# Patient Record
Sex: Female | Born: 1960 | Race: Black or African American | Hispanic: No | Smoking: Current every day smoker
Health system: Southern US, Community
[De-identification: ages and names within clinical notes are randomized; demographics above are authoritative.]

## PROBLEM LIST (undated history)

## (undated) DIAGNOSIS — E119 Type 2 diabetes mellitus without complications: Secondary | ICD-10-CM

## (undated) DIAGNOSIS — I10 Essential (primary) hypertension: Secondary | ICD-10-CM

## (undated) DIAGNOSIS — J45909 Unspecified asthma, uncomplicated: Secondary | ICD-10-CM

## (undated) DIAGNOSIS — E7800 Pure hypercholesterolemia, unspecified: Secondary | ICD-10-CM

---

## 2021-09-04 ENCOUNTER — Encounter (HOSPITAL_BASED_OUTPATIENT_CLINIC_OR_DEPARTMENT_OTHER): Payer: Self-pay | Admitting: Emergency Medicine

## 2021-09-04 ENCOUNTER — Emergency Department (HOSPITAL_BASED_OUTPATIENT_CLINIC_OR_DEPARTMENT_OTHER): Payer: Medicare HMO

## 2021-09-04 ENCOUNTER — Other Ambulatory Visit: Payer: Self-pay

## 2021-09-04 ENCOUNTER — Emergency Department (HOSPITAL_BASED_OUTPATIENT_CLINIC_OR_DEPARTMENT_OTHER)
Admission: EM | Admit: 2021-09-04 | Discharge: 2021-09-04 | Disposition: A | Discharge status: Home / Self Care | Payer: Medicare HMO | Attending: Emergency Medicine | Admitting: Emergency Medicine

## 2021-09-04 DIAGNOSIS — I10 Essential (primary) hypertension: Secondary | ICD-10-CM | POA: Diagnosis not present

## 2021-09-04 DIAGNOSIS — E119 Type 2 diabetes mellitus without complications: Secondary | ICD-10-CM | POA: Diagnosis not present

## 2021-09-04 DIAGNOSIS — M545 Low back pain, unspecified: Secondary | ICD-10-CM

## 2021-09-04 DIAGNOSIS — M47896 Other spondylosis, lumbar region: Secondary | ICD-10-CM | POA: Insufficient documentation

## 2021-09-04 DIAGNOSIS — F17210 Nicotine dependence, cigarettes, uncomplicated: Secondary | ICD-10-CM | POA: Diagnosis not present

## 2021-09-04 DIAGNOSIS — J45909 Unspecified asthma, uncomplicated: Secondary | ICD-10-CM | POA: Diagnosis not present

## 2021-09-04 DIAGNOSIS — M5136 Other intervertebral disc degeneration, lumbar region: Secondary | ICD-10-CM | POA: Insufficient documentation

## 2021-09-04 HISTORY — DX: Pure hypercholesterolemia, unspecified: E78.00

## 2021-09-04 HISTORY — DX: Unspecified asthma, uncomplicated: J45.909

## 2021-09-04 HISTORY — DX: Type 2 diabetes mellitus without complications: E11.9

## 2021-09-04 HISTORY — DX: Essential (primary) hypertension: I10

## 2021-09-04 LAB — COMPREHENSIVE METABOLIC PANEL
ALT: 35 U/L (ref 0–44)
AST: 21 U/L (ref 15–41)
Albumin: 4.1 g/dL (ref 3.5–5.0)
Alkaline Phosphatase: 61 U/L (ref 38–126)
Anion gap: 8 (ref 5–15)
BUN: 20 mg/dL (ref 6–20)
CO2: 29 mmol/L (ref 22–32)
Calcium: 9.5 mg/dL (ref 8.9–10.3)
Chloride: 103 mmol/L (ref 98–111)
Creatinine, Ser: 0.54 mg/dL (ref 0.44–1.00)
GFR, Estimated: >60 mL/min (ref >60)
Glucose, Bld: 172 mg/dL — ABNORMAL HIGH (ref 70–99)
Potassium: 3.7 mmol/L (ref 3.5–5.1)
Sodium: 140 mmol/L (ref 135–145)
Total Bilirubin: 0.4 mg/dL (ref 0.3–1.2)
Total Protein: 7.5 g/dL (ref 6.5–8.1)

## 2021-09-04 LAB — URINALYSIS, ROUTINE W REFLEX MICROSCOPIC
Bilirubin Urine: NEGATIVE
Glucose, UA: NEGATIVE mg/dL
Hgb urine dipstick: NEGATIVE
Ketones, ur: NEGATIVE mg/dL
Leukocytes,Ua: NEGATIVE
Nitrite: NEGATIVE
Protein, ur: NEGATIVE mg/dL
Specific Gravity, Urine: 1.025 (ref 1.005–1.030)
pH: 5 (ref 5.0–8.0)

## 2021-09-04 LAB — CBC WITH DIFFERENTIAL/PLATELET
Abs Immature Granulocytes: 0.03 10*3/uL (ref 0.00–0.07)
Basophils Absolute: 0 10*3/uL (ref 0.0–0.1)
Basophils Relative: 0 %
Eosinophils Absolute: 0.1 10*3/uL (ref 0.0–0.5)
Eosinophils Relative: 1 %
HCT: 39 % (ref 36.0–46.0)
Hemoglobin: 12.5 g/dL (ref 12.0–15.0)
Immature Granulocytes: 0 %
Lymphocytes Relative: 49 %
Lymphs Abs: 3.9 10*3/uL (ref 0.7–4.0)
MCH: 28.9 pg (ref 26.0–34.0)
MCHC: 32.1 g/dL (ref 30.0–36.0)
MCV: 90.1 fL (ref 80.0–100.0)
Monocytes Absolute: 0.6 10*3/uL (ref 0.1–1.0)
Monocytes Relative: 7 %
Neutro Abs: 3.6 10*3/uL (ref 1.7–7.7)
Neutrophils Relative %: 43 %
Platelets: 188 10*3/uL (ref 150–400)
RBC: 4.33 MIL/uL (ref 3.87–5.11)
RDW: 15.6 % — ABNORMAL HIGH (ref 11.5–15.5)
WBC: 8.2 10*3/uL (ref 4.0–10.5)
nRBC: 0 % (ref 0.0–0.2)

## 2021-09-04 LAB — LIPASE, BLOOD: Lipase: 42 U/L (ref 11–51)

## 2021-09-04 MED ORDER — METHOCARBAMOL 500 MG PO TABS: 500.0000 mg | ORAL_TABLET | Freq: Two times a day (BID) | ORAL | 0 refills | Status: AC

## 2021-09-04 MED ORDER — NAPROXEN 500 MG PO TABS: 500.0000 mg | ORAL_TABLET | Freq: Two times a day (BID) | ORAL | 0 refills | Status: AC

## 2021-09-04 MED ORDER — METHOCARBAMOL 500 MG PO TABS: 500.0000 mg | ORAL_TABLET | Freq: Two times a day (BID) | ORAL | 0 refills | Status: DC

## 2021-09-04 MED ORDER — HYDROCODONE-ACETAMINOPHEN 5-325 MG PO TABS: 1.0000 | ORAL_TABLET | ORAL | 0 refills | Status: DC | PRN

## 2021-09-04 MED ORDER — NAPROXEN 500 MG PO TABS: 500.0000 mg | ORAL_TABLET | Freq: Two times a day (BID) | ORAL | 0 refills | Status: DC

## 2021-09-04 MED ORDER — HYDROCODONE-ACETAMINOPHEN 5-325 MG PO TABS: 1.0000 | ORAL_TABLET | ORAL | 0 refills | Status: AC | PRN

## 2021-09-04 NOTE — ED Provider Notes (Signed)
[facility] EMERGENCY DEPARTMENT Provider Note   CSN: [id number] Arrival date & time: 09/04/21  1550     History Chief Complaint  Patient presents with   Back Pain    Christy Mahoney is a 60 y.o. female with PMHx HTN, high cholesterol, diabetes who presents to the ED today with complaint of gradual onset, constant, achy, mid lower back pain for the past few weeks.  Patient reports history of right knee surgery in August of this year.  She states that she was going through physical therapy and had been doing well however had some mild pain in her back that she attributed to walking differently and favoring her left leg secondary to her right surgery.  She states that her right knee has been healing well however she has continued to have back pain that has worsened over the past week.  She has been taking arthritis Tylenol without significant relief.  She also complains of a fullness in her suprapubic area and urinary frequency.  She reports history of UTI in January and states this feels similar.  She denies any fevers, chills, weakness/numbness/tingling in lower extremities, saddle anesthesia, urinary incontinence, urinary retention, bowel incontinence, nausea, vomiting, vaginal discharge, any other associated symptoms. No IVDA.   The history is provided by the patient and medical records.      Past Medical History:  Diagnosis Date   Asthma    Diabetes mellitus without complication (HCC)    High cholesterol    Hypertension     There are no problems to display for this patient.   History reviewed. No pertinent surgical history.   OB History   No obstetric history on file.     No family history on file.  Social History   Tobacco Use   Smoking status: Every Day    Types: Cigarettes   Smokeless tobacco: Never  Vaping Use   Vaping Use: Never used  Substance Use Topics   Alcohol use: Yes   Drug use: Never    Home Medications Prior to Admission  medications   Medication Sig Start Date End Date Taking? Authorizing Provider  HYDROcodone-acetaminophen (NORCO/VICODIN) 5-325 MG tablet Take 1 tablet by mouth every 4 (four) hours as needed. 09/04/21  Yes Carin Shipp, PA-C  methocarbamol (ROBAXIN) 500 MG tablet Take 1 tablet (500 mg total) by mouth 2 (two) times daily. 09/04/21  Yes Breyona Swander, PA-C  naproxen (NAPROSYN) 500 MG tablet Take 1 tablet (500 mg total) by mouth 2 (two) times daily. 09/04/21  Yes Tanda Rockers, PA-C    Allergies    Percocet [oxycodone-acetaminophen]  Review of Systems   Review of Systems  Constitutional:  Negative for chills and fever.  Gastrointestinal:  Positive for abdominal pain. Negative for constipation, diarrhea, nausea and vomiting.  Genitourinary:  Positive for frequency. Negative for flank pain, hematuria, urgency and vaginal pain.  Musculoskeletal:  Positive for back pain.  All other systems reviewed and are negative.  Physical Exam Updated Vital Signs BP (!) 147/68 (BP Location: Right Arm)   Pulse 73   Temp 98.2 F (36.8 C) (Oral)   Resp 18   Ht 5\' 3"  (1.6 m)   Wt 89.4 kg   SpO2 95%   BMI 34.90 kg/m   Physical Exam Vitals and nursing note reviewed.  Constitutional:      Appearance: She is not ill-appearing or diaphoretic.  HENT:     Head: Normocephalic and atraumatic.  Eyes:     Conjunctiva/sclera: Conjunctivae normal.  Cardiovascular:     Rate and Rhythm: Normal rate and regular rhythm.     Pulses: Normal pulses.  Pulmonary:     Effort: Pulmonary effort is normal.     Breath sounds: Normal breath sounds. No wheezing, rhonchi or rales.  Abdominal:     Palpations: Abdomen is soft.     Tenderness: There is no abdominal tenderness. There is no guarding or rebound.     Comments: Soft, NTND, +BS throughout, no r/g/r, neg murphy's, neg mcburney's, no CVA TTP  Musculoskeletal:     Cervical back: Neck supple.     Comments: No C or T midline spinal TTP. + midline lumbar  spinal TTP with associated right paralumbar musculature TTP. ROM intact to neck and back. Strength 5/5 to BUE and BLEs. Sensation intact throughout.   Skin:    General: Skin is warm and dry.  Neurological:     Mental Status: She is alert.    ED Results / Procedures / Treatments   Labs (all labs ordered are listed, but only abnormal results are displayed) Labs Reviewed  COMPREHENSIVE METABOLIC PANEL - Abnormal; Notable for the following components:      Result Value   Glucose, Bld 172 (*)    All other components within normal limits  CBC WITH DIFFERENTIAL/PLATELET - Abnormal; Notable for the following components:   RDW 15.6 (*)    All other components within normal limits  URINALYSIS, ROUTINE W REFLEX MICROSCOPIC  LIPASE, BLOOD    EKG None  Radiology DG Lumbar Spine Complete  Result Date: 09/04/2021 CLINICAL DATA:  Lower abdominal pain and back pain for 1 week EXAM: LUMBAR SPINE - COMPLETE 4+ VIEW COMPARISON:  None. FINDINGS: Frontal, bilateral oblique, and lateral views of the lumbar spine are obtained. There are 5 non-rib-bearing lumbar type vertebral bodies a grossly normal anatomic alignment. There are no acute displaced fractures. Mild spondylosis at the L5-S1 level. Sacroiliac joints are normal. IMPRESSION: 1. No acute displaced fracture. 2. Mild spondylosis at the lumbosacral junction. Electronically Signed   By: Sharlet Salina M.D.   On: 09/04/2021 21:52    Procedures Procedures   Medications Ordered in ED Medications - No data to display  ED Course  I have reviewed the triage vital signs and the nursing notes.  Pertinent labs & imaging results that were available during my care of the patient were reviewed by me and considered in my medical decision making (see chart for details).    MDM Rules/Calculators/A&P                           60 year old female presents to the ED today with complaint of lower back pain for the past several weeks, worsening recently with  new onset of urinary frequency and fullness in her suprapubic area for the past week.  On arrival to the ED vitals are stable.  She had a urinalysis done while in the waiting room which does not show any signs of infection or hemoglobin.  On my exam she has no obvious abdominal tenderness palpation however we will plan for abdominal labs given complaint.  She is also noted to have midline lumbar spinal tenderness palpation as well as right paralumbar musculature tenderness palpation.  She denies any trauma however we will plan for back x-ray at this time.  She has no red flag symptoms today concerning for cauda equina, spinal epidural abscess, AAA.  If x-ray negative and work-up reassuring will plan to  discharge home with pain medication and Ortho follow-up for her back pain.  She did recently have right knee surgery and likely may have been favoring her gait causing some back pain.  Labwork unremarkable at this time CBC without leukocytosis. CMP without electrolyte abnormalities. Lipase WNL.  Xray: IMPRESSION:  1. No acute displaced fracture.  2. Mild spondylosis at the lumbosacral junction.   Will discharge home at this time with short course of pain medication and muscle relaxers. Pt instructed to follow up with her PCP/ortho for further eval. She is in agreement with plan and stable for discharge home.   This note was prepared using Dragon voice recognition software and may include unintentional dictation errors due to the inherent limitations of voice recognition software.   Final Clinical Impression(s) / ED Diagnoses Final diagnoses:  Acute midline low back pain without sciatica  Degenerative disc disease, lumbar    Rx / DC Orders ED Discharge Orders          Ordered    methocarbamol (ROBAXIN) 500 MG tablet  2 times daily        09/04/21 2235    naproxen (NAPROSYN) 500 MG tablet  2 times daily        09/04/21 2235    HYDROcodone-acetaminophen (NORCO/VICODIN) 5-325 MG tablet  Every 4  hours PRN        09/04/21 2235             Discharge Instructions      Please follow up with both your PCP and orthopedist for further evaluation of your back pain  Pick up medication and take as prescribed. I would recommend taking the muscle relaxer at nighttime to help you sleep and to take the antiinflammatory during the day. Take the Norco as needed for severe pain. DO NOT DRIVE OR DRINK ALCOHOL WHILE ON THE MUSCLE RELAXER AS IT CAN MAKE YOU DROWSY.   Return to the ED for any new/worsening symptoms       Tanda Rockers, Cordelia Poche 09/04/21 2236    Terald Sleeper, MD 09/05/21 1027

## 2021-09-04 NOTE — Discharge Instructions (Signed)
Please follow up with both your PCP and orthopedist for further evaluation of your back pain  Pick up medication and take as prescribed. I would recommend taking the muscle relaxer at nighttime to help you sleep and to take the antiinflammatory during the day. Take the Norco as needed for severe pain. DO NOT DRIVE OR DRINK ALCOHOL WHILE ON THE MUSCLE RELAXER AS IT CAN MAKE YOU DROWSY.   Return to the ED for any new/worsening symptoms

## 2021-09-04 NOTE — ED Triage Notes (Signed)
Lower abd pain and back pain x 1 week ago got bad yesterday , having freq and urgeency

## 2022-12-23 IMAGING — DX DG LUMBAR SPINE COMPLETE 4+V
5 series · 5 of 5 positions shown · non-contrast
Comparison: None.

CLINICAL DATA: Lower abdominal pain and back pain for 1 week

EXAM:
LUMBAR SPINE - COMPLETE 4+ VIEW

[l-spine ap]
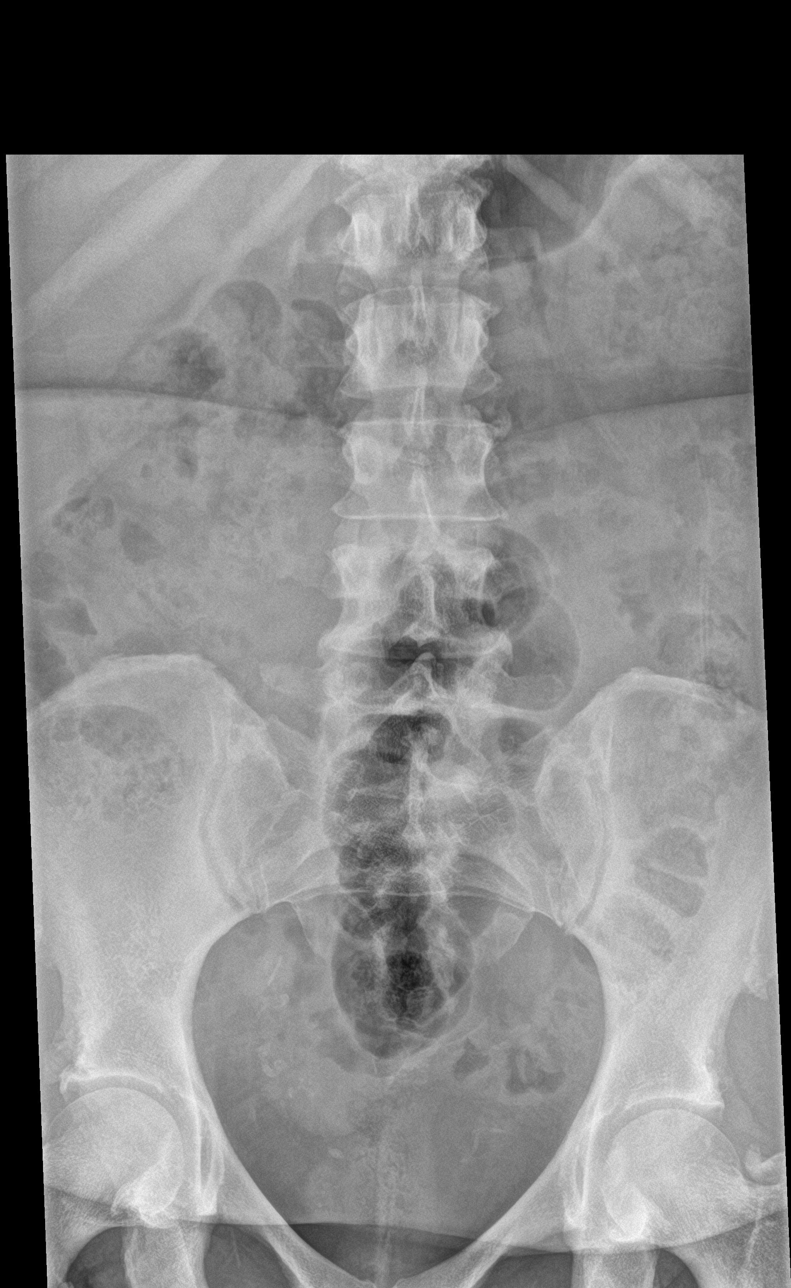

[l-spine obl (1 of 2)]
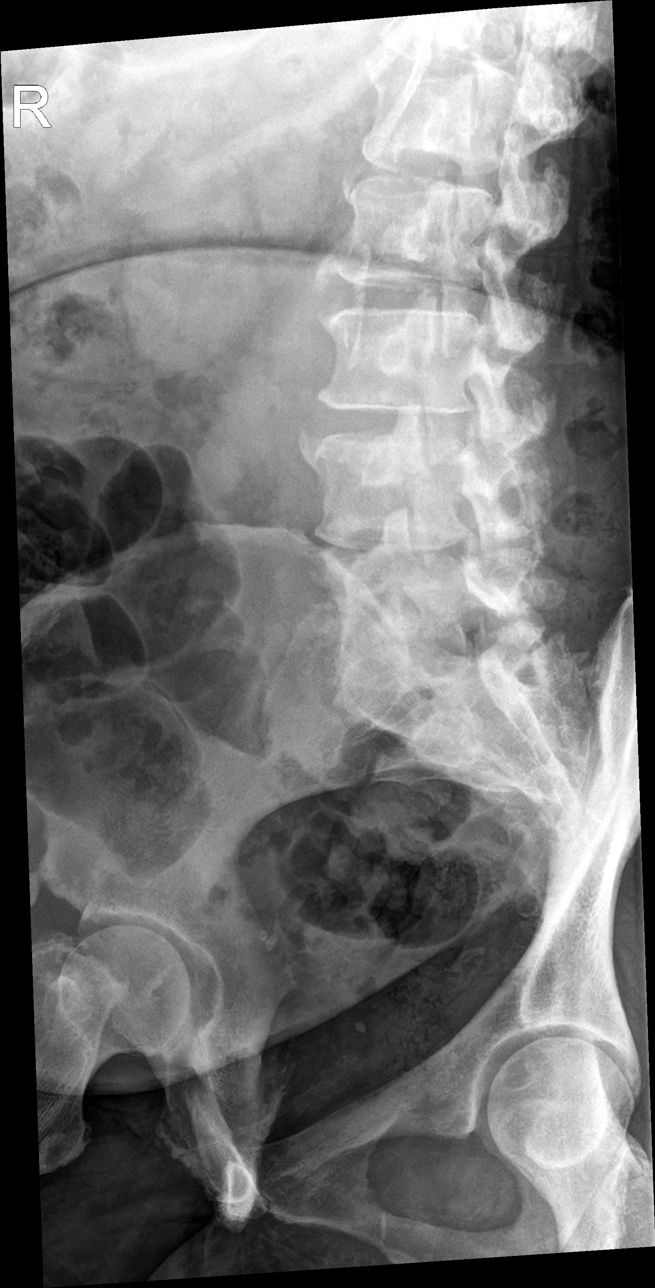

[l-spine obl (2 of 2)]
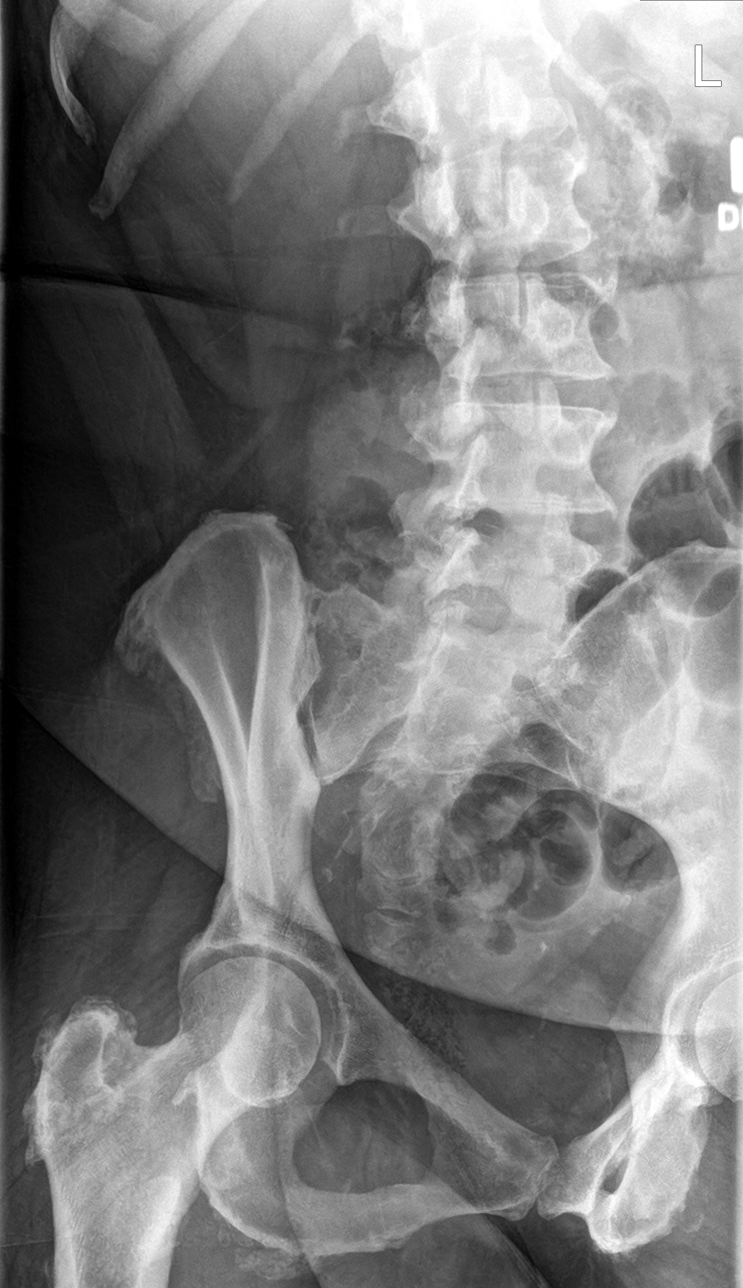

[l-spine lat]
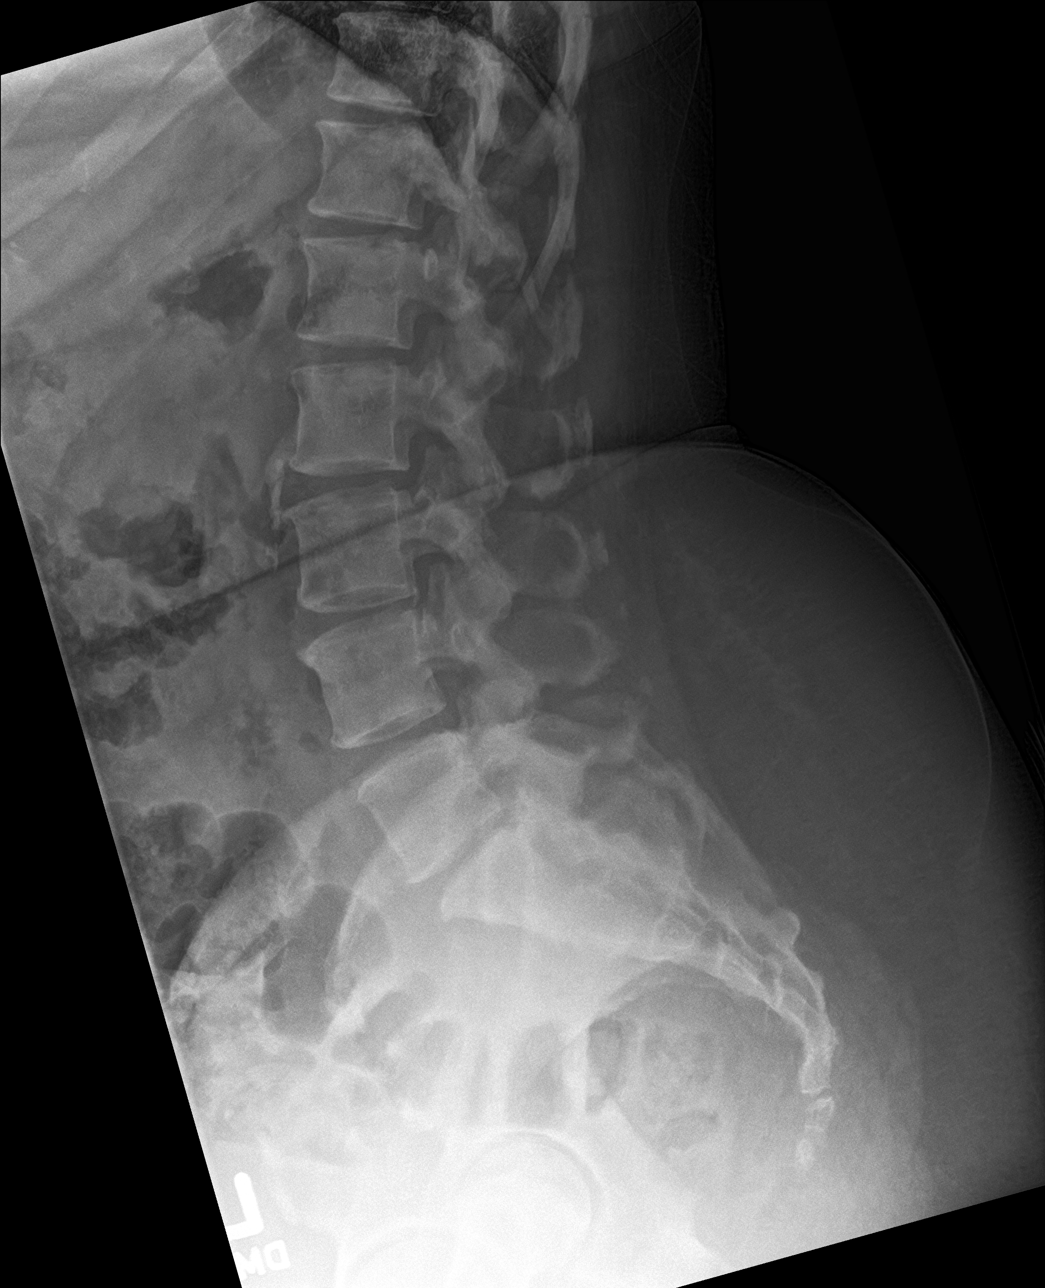

[l-spine spot]
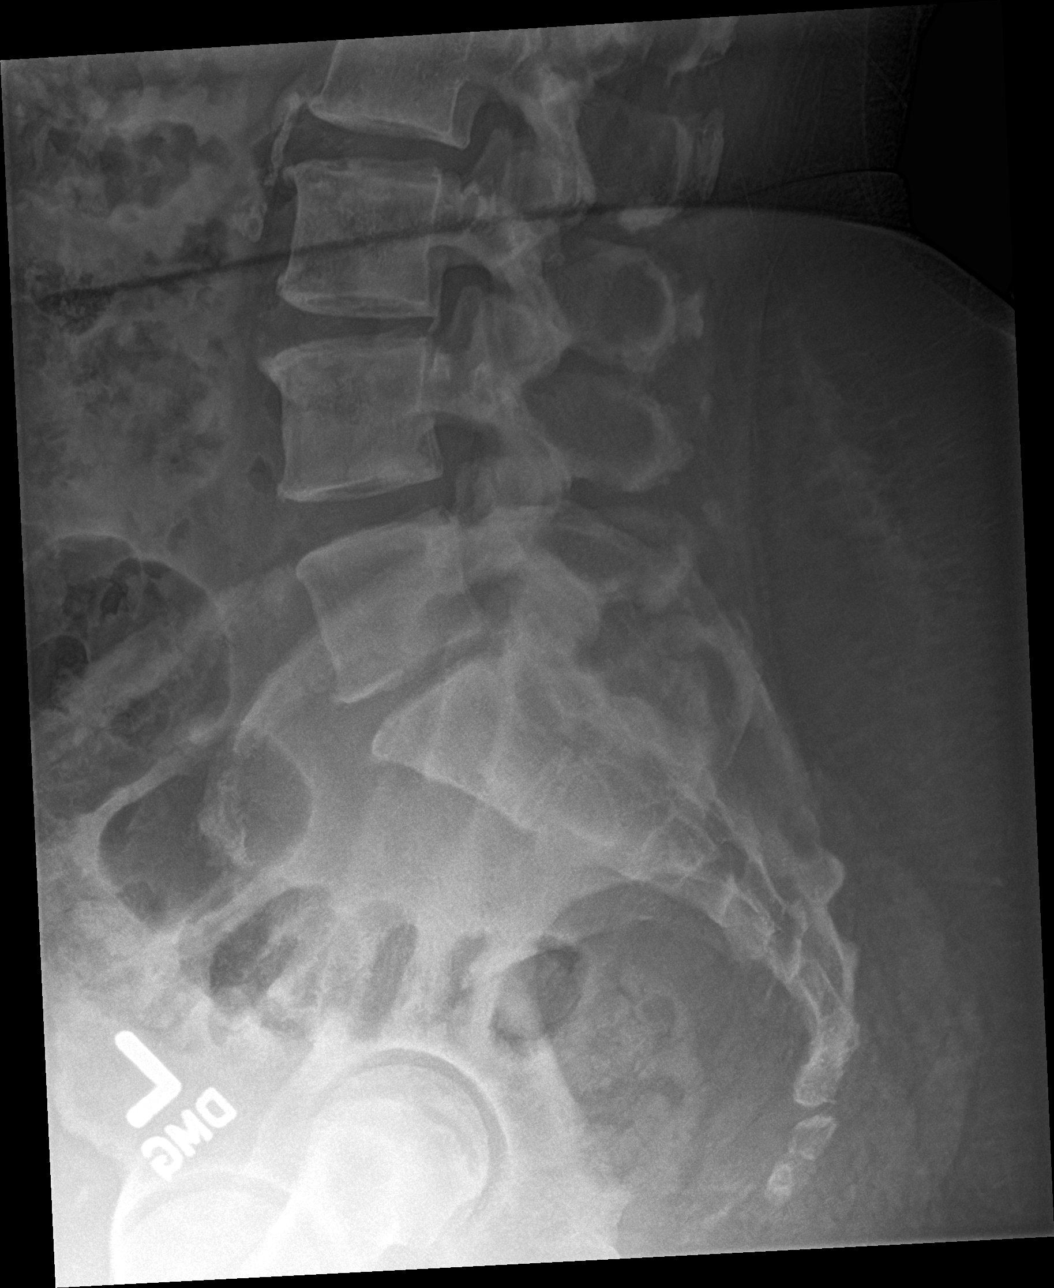

[5 of 5 positions shown; findings below may reference images not displayed]

FINDINGS: Frontal, bilateral oblique, and lateral views of the lumbar spine
are obtained. There are 5 non-rib-bearing lumbar type vertebral
bodies a grossly normal anatomic alignment. There are no acute
displaced fractures. Mild spondylosis at the L5-S1 level. Sacroiliac
joints are normal.
IMPRESSION: 1. No acute displaced fracture.
2. Mild spondylosis at the lumbosacral junction.
